# Patient Record
Sex: Male | Born: 1990 | Race: White | Hispanic: No | Marital: Single | Smoking: Current every day smoker
Health system: Southern US, Community
[De-identification: ages and names within clinical notes are randomized; demographics above are authoritative.]

---

## 2014-05-21 ENCOUNTER — Emergency Department (INDEPENDENT_AMBULATORY_CARE_PROVIDER_SITE_OTHER)
Admission: EM | Admit: 2014-05-21 | Discharge: 2014-05-21 | Disposition: A | Discharge status: Home / Self Care | Source: Home / Self Care | Attending: Emergency Medicine | Admitting: Emergency Medicine

## 2014-05-21 ENCOUNTER — Encounter: Payer: Self-pay | Admitting: Emergency Medicine

## 2014-05-21 DIAGNOSIS — J069 Acute upper respiratory infection, unspecified: Secondary | ICD-10-CM

## 2014-05-21 DIAGNOSIS — J029 Acute pharyngitis, unspecified: Secondary | ICD-10-CM

## 2014-05-21 DIAGNOSIS — J028 Acute pharyngitis due to other specified organisms: Secondary | ICD-10-CM

## 2014-05-21 LAB — POCT RAPID STREP A (OFFICE): Rapid Strep A Screen: NEGATIVE

## 2014-05-21 MED ORDER — AMOXICILLIN-POT CLAVULANATE 875-125 MG PO TABS: 1.0000 | ORAL_TABLET | Freq: Two times a day (BID) | ORAL | Status: DC

## 2014-05-21 MED ORDER — PREDNISONE (PAK) 10 MG PO TABS: ORAL_TABLET | Freq: Every day | ORAL | Status: DC

## 2014-05-21 NOTE — ED Notes (Signed)
Nicholas Dalton complains of sore throat, congestion, wheezing, hoarseness and productive cough with yellow sputum for 1 month. Denies fever, chills or sweats.

## 2014-05-21 NOTE — ED Provider Notes (Signed)
CSN: [id number]     Arrival date & time 05/21/14  1020 History   First MD Initiated Contact with Patient 05/21/14 1037     No chief complaint on file.  (Consider location/radiation/quality/duration/timing/severity/associated sxs/prior Treatment) HPI Nicholas Dalton is a 23 y.o. male who complains of onset of cold symptoms for a few weeks.  The symptoms are constant and mild-moderate in severity.  No history of allergies and has lived in [place] his whole life.  Hasn't taken any OTC meds. + sore throat + cough No pleuritic pain No wheezing + nasal congestion + post-nasal drainage + sinus pain/pressure No chest congestion No itchy/red eyes No earache No hemoptysis No SOB No chills/sweats No fever No nausea No vomiting No abdominal pain No diarrhea No skin rashes No fatigue No myalgias + headache     No past medical history on file. No past surgical history on file. No family history on file. History  Substance Use Topics  . Smoking status: Not on file  . Smokeless tobacco: Not on file  . Alcohol Use: Not on file    Review of Systems  All other systems reviewed and are negative.   Allergies  Review of patient's allergies indicates not on file.  Home Medications   Prior to Admission medications   Medication Sig Start Date End Date Taking? Authorizing Provider  amoxicillin-clavulanate (AUGMENTIN) 875-125 MG per tablet Take 1 tablet by mouth 2 (two) times daily. 05/21/14   Marlaine HindJeffrey H Henderson, MD  predniSONE (STERAPRED UNI-PAK) 10 MG tablet Take by mouth daily. 6 day pack, use as directed 05/21/14   Marlaine HindJeffrey H Henderson, MD   There were no vitals taken for this visit. Physical Exam  Nursing note and vitals reviewed. Constitutional: He is oriented to person, place, and time. He appears well-developed and well-nourished.  HENT:  Head: Normocephalic and atraumatic.  Right Ear: Tympanic membrane, external ear and ear canal normal.  Left Ear: Tympanic membrane, external ear and ear  canal normal.  Nose: Mucosal edema and rhinorrhea present.  Mouth/Throat: Posterior oropharyngeal erythema present. No oropharyngeal exudate or posterior oropharyngeal edema.  Eyes: No scleral icterus.  Neck: Neck supple.  Cardiovascular: Regular rhythm and normal heart sounds.   Pulmonary/Chest: Effort normal and breath sounds normal. No respiratory distress.  Neurological: He is alert and oriented to person, place, and time.  Skin: Skin is warm and dry.  Psychiatric: He has a normal mood and affect. His speech is normal.    ED Course  Procedures (including critical care time) Labs Review Labs Reviewed - No data to display  Imaging Review No results found.   MDM   1. Acute upper respiratory infections of unspecified site   2. Acute pharyngitis due to other specified organisms    1)  Take the prescribed antibiotic as instructed and prednisone.  Pt declines testing for Mono/EBV, but should do that if not improving.  Also consider Flonase if not improving. 2)  Use nasal saline solution (over the counter) at least 3 times a day. 3)  Use over the counter decongestants like Zyrtec-D every 12 hours as needed to help with congestion.  If you have hypertension, do not take medicines with sudafed.  4)  Can take tylenol every 6 hours or motrin every 8 hours for pain or fever. 5)  Follow up with your primary doctor if no improvement in 5-7 days, sooner if increasing pain, fever, or new symptoms.     Marlaine HindJeffrey H Henderson, MD 05/21/14 1052

## 2014-05-25 ENCOUNTER — Encounter (HOSPITAL_BASED_OUTPATIENT_CLINIC_OR_DEPARTMENT_OTHER): Payer: Self-pay | Admitting: Emergency Medicine

## 2014-05-25 ENCOUNTER — Emergency Department (HOSPITAL_BASED_OUTPATIENT_CLINIC_OR_DEPARTMENT_OTHER)

## 2014-05-25 ENCOUNTER — Emergency Department (HOSPITAL_BASED_OUTPATIENT_CLINIC_OR_DEPARTMENT_OTHER)
Admission: EM | Admit: 2014-05-25 | Discharge: 2014-05-25 | Disposition: A | Discharge status: Home / Self Care | Attending: Emergency Medicine | Admitting: Emergency Medicine

## 2014-05-25 DIAGNOSIS — J209 Acute bronchitis, unspecified: Secondary | ICD-10-CM | POA: Insufficient documentation

## 2014-05-25 DIAGNOSIS — Z79899 Other long term (current) drug therapy: Secondary | ICD-10-CM | POA: Insufficient documentation

## 2014-05-25 DIAGNOSIS — F17200 Nicotine dependence, unspecified, uncomplicated: Secondary | ICD-10-CM | POA: Insufficient documentation

## 2014-05-25 DIAGNOSIS — J40 Bronchitis, not specified as acute or chronic: Secondary | ICD-10-CM

## 2014-05-25 DIAGNOSIS — IMO0002 Reserved for concepts with insufficient information to code with codable children: Secondary | ICD-10-CM | POA: Insufficient documentation

## 2014-05-25 LAB — CBC WITH DIFFERENTIAL/PLATELET
BASOS ABS: 0 10*3/uL (ref 0.0–0.1)
Basophils Relative: 0 % (ref 0–1)
EOS PCT: 1 % (ref 0–5)
Eosinophils Absolute: 0.1 10*3/uL (ref 0.0–0.7)
HCT: 41.7 % (ref 39.0–52.0)
Hemoglobin: 13.9 g/dL (ref 13.0–17.0)
Lymphocytes Relative: 29 % (ref 12–46)
Lymphs Abs: 2.2 10*3/uL (ref 0.7–4.0)
MCH: 28.7 pg (ref 26.0–34.0)
MCHC: 33.3 g/dL (ref 30.0–36.0)
MCV: 86 fL (ref 78.0–100.0)
Monocytes Absolute: 0.8 10*3/uL (ref 0.1–1.0)
Monocytes Relative: 10 % (ref 3–12)
NEUTROS PCT: 60 % (ref 43–77)
Neutro Abs: 4.4 10*3/uL (ref 1.7–7.7)
Platelets: 260 10*3/uL (ref 150–400)
RBC: 4.85 MIL/uL (ref 4.22–5.81)
RDW: 12.7 % (ref 11.5–15.5)
WBC: 7.5 10*3/uL (ref 4.0–10.5)

## 2014-05-25 LAB — BASIC METABOLIC PANEL
Anion gap: 14 (ref 5–15)
BUN: 16 mg/dL (ref 6–23)
CALCIUM: 9.3 mg/dL (ref 8.4–10.5)
CO2: 26 mEq/L (ref 19–32)
Chloride: 103 mEq/L (ref 96–112)
Creatinine, Ser: 0.9 mg/dL (ref 0.50–1.35)
GFR calc non Af Amer: >90 mL/min (ref >90)
Glucose, Bld: 93 mg/dL (ref 70–99)
POTASSIUM: 4 meq/L (ref 3.7–5.3)
SODIUM: 143 meq/L (ref 137–147)

## 2014-05-25 MED ORDER — DM-GUAIFENESIN ER 30-600 MG PO TB12: 1.0000 | ORAL_TABLET | Freq: Two times a day (BID) | ORAL | Status: DC

## 2014-05-25 MED ORDER — ALBUTEROL SULFATE HFA 108 (90 BASE) MCG/ACT IN AERS: 1.0000 | INHALATION_SPRAY | Freq: Four times a day (QID) | RESPIRATORY_TRACT | Status: DC | PRN

## 2014-05-25 MED ORDER — AZITHROMYCIN 250 MG PO TABS: 250.0000 mg | ORAL_TABLET | Freq: Every day | ORAL | Status: DC

## 2014-05-25 NOTE — ED Notes (Signed)
Pt amb to room 4 with quick steady gait in nad. Pt reports one month of intermittent cough, seen by urgent care last week and rx prednisone and amox, cont with cough.

## 2014-05-25 NOTE — ED Provider Notes (Signed)
CSN: [id number]     Arrival date & time 05/25/14  [id number] History   First MD Initiated Contact with Patient 05/25/14 [phone]     Chief Complaint  Patient presents with  . Cough     (Consider location/radiation/quality/duration/timing/severity/associated sxs/prior Treatment) Patient is a 23 y.o. male presenting with cough. The history is provided by the patient.  Cough Associated symptoms: no chest pain, no fever, no headaches, no rash and no sore throat    patient with persistent intermittent cough. For the past week or 2. Started coughing up blood this morning. Less than a cup in size. No fevers. No sore throat. Patient was seen in urgent care last week did not have a chest x-ray but was treated with prednisone amoxicillin symptoms have continued. They felt at that time was consistent with a bronchitis. Patient without any other systemic symptoms.  History reviewed. No pertinent past medical history. History reviewed. No pertinent past surgical history. Family History  Problem Relation Age of Onset  . Arrhythmia Mother   . Diabetes Father   . Hypertension Father    History  Substance Use Topics  . Smoking status: Current Every Day Smoker -- 1.00 packs/day for 10 years    Types: Cigarettes  . Smokeless tobacco: Never Used  . Alcohol Use: Yes    Review of Systems  Constitutional: Negative for fever.  HENT: Positive for congestion. Negative for sore throat.   Eyes: Negative for visual disturbance.  Respiratory: Positive for cough.   Cardiovascular: Negative for chest pain.  Gastrointestinal: Negative for abdominal pain.  Genitourinary: Negative for hematuria.  Musculoskeletal: Negative for back pain.  Skin: Negative for rash.  Neurological: Negative for headaches.  Hematological: Does not bruise/bleed easily.  Psychiatric/Behavioral: Negative for confusion.      Allergies  Review of patient's allergies indicates no known allergies.  Home Medications   Prior to Admission  medications   Medication Sig Start Date End Date Taking? Authorizing Provider  albuterol (PROVENTIL HFA;VENTOLIN HFA) 108 (90 BASE) MCG/ACT inhaler Inhale 1-2 puffs into the lungs every 6 (six) hours as needed for wheezing or shortness of breath. 05/25/14   Vanetta MuldersScott Simara Rhyner, MD  amoxicillin-clavulanate (AUGMENTIN) 875-125 MG per tablet Take 1 tablet by mouth 2 (two) times daily. 05/21/14   Marlaine HindJeffrey H Henderson, MD  azithromycin (ZITHROMAX) 250 MG tablet Take 1 tablet (250 mg total) by mouth daily. Take first 2 tablets together, then 1 every day until finished. 05/25/14   Vanetta MuldersScott Elby Blackwelder, MD  dextromethorphan-[facility]aiFENesin [facility](MUCINEX DM) 30-600 MG per 12 hr tablet Take 1 tablet by mouth 2 (two) times daily. 05/25/14   Vanetta MuldersScott Josejuan Hoaglin, MD  predniSONE (STERAPRED UNI-PAK) 10 MG tablet Take by mouth daily. 6 day pack, use as directed 05/21/14   Marlaine HindJeffrey H Henderson, MD   BP 130/82  Pulse 76  Temp(Src) 98.8 F (37.1 C) (Oral)  Resp 18  SpO2 99% Physical Exam  Nursing note and vitals reviewed. Constitutional: He is oriented to person, place, and time. He appears well-developed and well-nourished. No distress.  HENT:  Head: Normocephalic and atraumatic.  Mouth/Throat: Oropharynx is clear and moist. No oropharyngeal exudate.  Eyes: Conjunctivae and EOM are normal. Pupils are equal, round, and reactive to light.  Neck: Normal range of motion.  Cardiovascular: Normal rate, regular rhythm and normal heart sounds.   No murmur heard. Pulmonary/Chest: Effort normal and breath sounds normal. No respiratory distress. He has no wheezes. He has no rales.  Abdominal: Soft. Bowel sounds are normal. There is no tenderness.  Musculoskeletal: Normal range of motion. He exhibits no edema.  Neurological: He is alert and oriented to person, place, and time. No cranial nerve deficit. He exhibits normal muscle tone. Coordination normal.  Skin: Skin is warm. No erythema.    ED Course  Procedures (including critical care  time) Labs Review Labs Reviewed  BASIC METABOLIC PANEL  CBC WITH DIFFERENTIAL   Results for orders placed during the hospital encounter of 05/25/14  BASIC METABOLIC PANEL      Result Value Ref Range   Sodium 143  137 - 147 mEq/L   Potassium 4.0  3.7 - 5.3 mEq/L   Chloride 103  96 - 112 mEq/L   CO2 26  19 - 32 mEq/L   Glucose, Bld 93  70 - 99 mg/dL   BUN 16  6 - 23 mg/dL   Creatinine, Ser 6.040.90  0.50 - 1.35 mg/dL   Calcium 9.3  8.4 - 54.010.5 mg/dL   GFR calc non Af Amer >90  >90 mL/min   GFR calc Af Amer >90  >90 mL/min   Anion gap 14  5 - 15  CBC WITH DIFFERENTIAL      Result Value Ref Range   WBC 7.5  4.0 - 10.5 K/uL   RBC 4.85  4.22 - 5.81 MIL/uL   Hemoglobin 13.9  13.0 - 17.0 g/dL   HCT 98.141.7  19.139.0 - 47.852.0 %   MCV 86.0  78.0 - 100.0 fL   MCH 28.7  26.0 - 34.0 pg   MCHC 33.3  30.0 - 36.0 g/dL   RDW 29.512.7  62.111.5 - 30.815.5 %   Platelets 260  150 - 400 K/uL   Neutrophils Relative % 60  43 - 77 %   Lymphocytes Relative 29  12 - 46 %   Monocytes Relative 10  3 - 12 %   Eosinophils Relative 1  0 - 5 %   Basophils Relative 0  0 - 1 %   Neutro Abs 4.4  1.7 - 7.7 K/uL   Lymphs Abs 2.2  0.7 - 4.0 K/uL   Monocytes Absolute 0.8  0.1 - 1.0 K/uL   Eosinophils Absolute 0.1  0.0 - 0.7 K/uL   Basophils Absolute 0.0  0.0 - 0.1 K/uL   Smear Review MORPHOLOGY UNREMARKABLE       Imaging Review Dg Chest 2 View  05/25/2014   CLINICAL DATA:  Cough.  EXAM: CHEST  2 VIEW  COMPARISON:  None.  FINDINGS: The heart size and mediastinal contours are within normal limits. Both lungs are clear. The visualized skeletal structures are unremarkable.  IMPRESSION: No active cardiopulmonary disease.   Electronically Signed   By: Maisie Fushomas  Register   On: 05/25/2014 10:18     EKG Interpretation None      MDM   Final diagnoses:  Bronchitis    Symptoms consistent with a bronchitis is just been lasting for a little bit longer than usual. Now coughing up some phlegm. Patient was seen in urgent care her last week  treated with prednisone and amoxicillin still with cough. We'll give a trial of Zithromax here. Mucinex DM and albuterol inhaler. Lab workup without any significant abnormalities no leukocytosis no anemia. Electrolytes normal. Chest x-ray negative for pneumonia pneumothorax or pulmonary edema. 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 Home on leave.    Vanetta MuldersScott Alvetta Hidrogo, MD 05/25/14 1130

## 2014-05-25 NOTE — Discharge Instructions (Signed)
Bronchitis Bronchitis is swelling (inflammation) of the air tubes leading to your lungs (bronchi). This causes mucus and a cough. If the swelling gets bad, you may have trouble breathing. HOME CARE   Rest.  Drink enough fluids to keep your pee (urine) clear or pale yellow (unless you have a condition where you have to watch how much you drink).  Only take medicine as told by your doctor. If you were given antibiotic medicines, finish them even if you start to feel better.  Avoid smoke, irritating chemicals, and strong smells. These make the problem worse. Quit smoking if you smoke. This helps your lungs heal faster.  Use a cool mist humidifier. Change the water in the humidifier every day. You can also sit in the bathroom with hot shower running for 5-10 minutes. Keep the door closed.  See your health care provider as told.  Wash your hands often. GET HELP IF: Your problems do not get better after 1 week. GET HELP RIGHT AWAY IF:   Your fever gets worse.  You have chills.  Your chest hurts.  Your problems breathing get worse.  You have blood in your mucus.  You pass out (faint).  You feel light-headed.  You have a bad headache.  You throw up (vomit) again and again. MAKE SURE YOU:  Understand these instructions.  Will watch your condition.  Will get help right away if you are not doing well or get worse. Document Released: 04/23/2008 Document Revised: 11/10/2013 Document Reviewed: 06/30/2013 [facility] Patient Information 2015 [employer], [place]. This information is not intended to replace advice given to you by your health care provider. Make sure you discuss any questions you have with your health care provider. Use medications as directed. Return for any newer worse symptoms. Chest x-ray was negative and labs were normal.

## 2016-02-06 IMAGING — CR DG CHEST 2V
2 series · 2 of 2 positions shown · non-contrast
Comparison: None.

CLINICAL DATA: Cough.

EXAM:
CHEST  2 VIEW

[w chest pa]
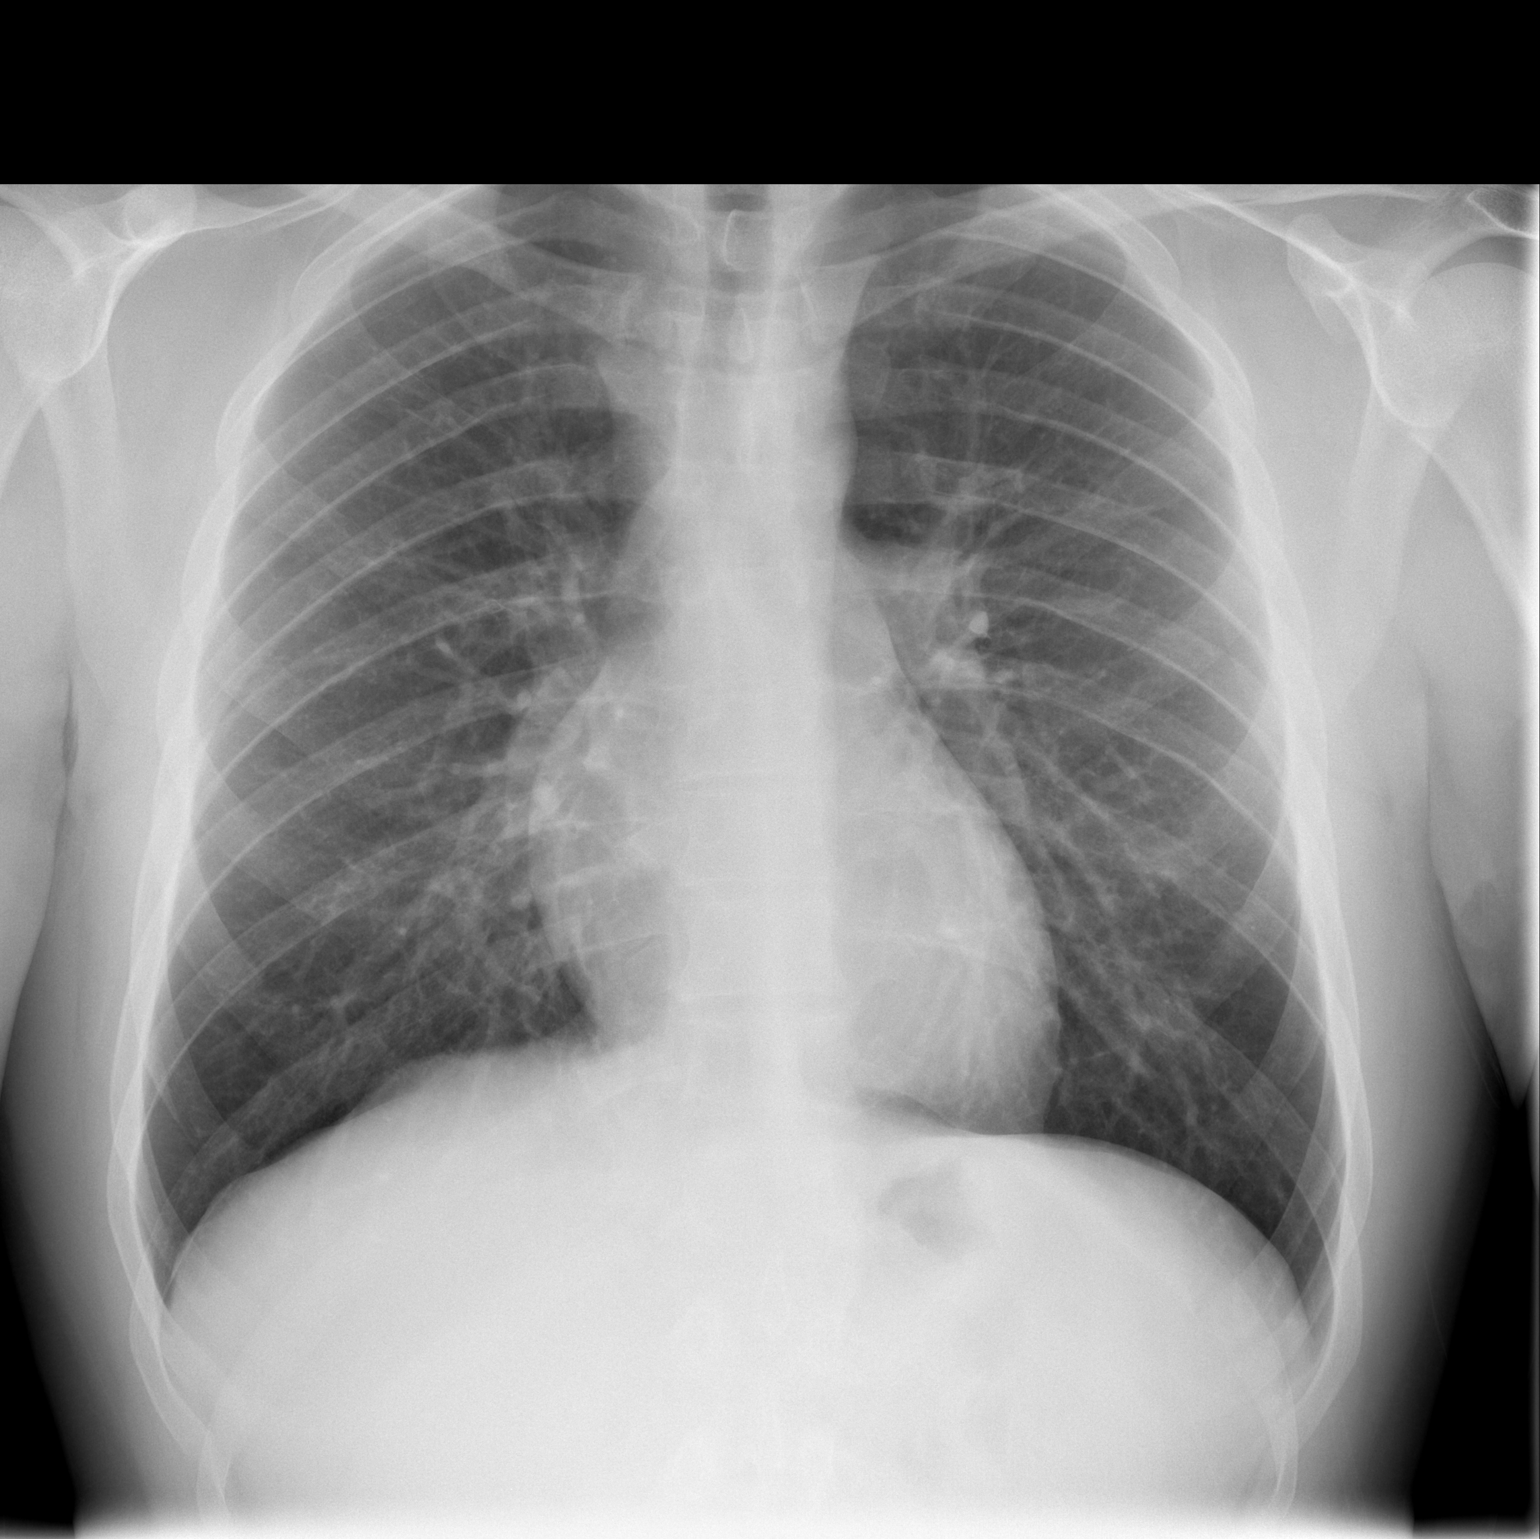

[w chest lat]
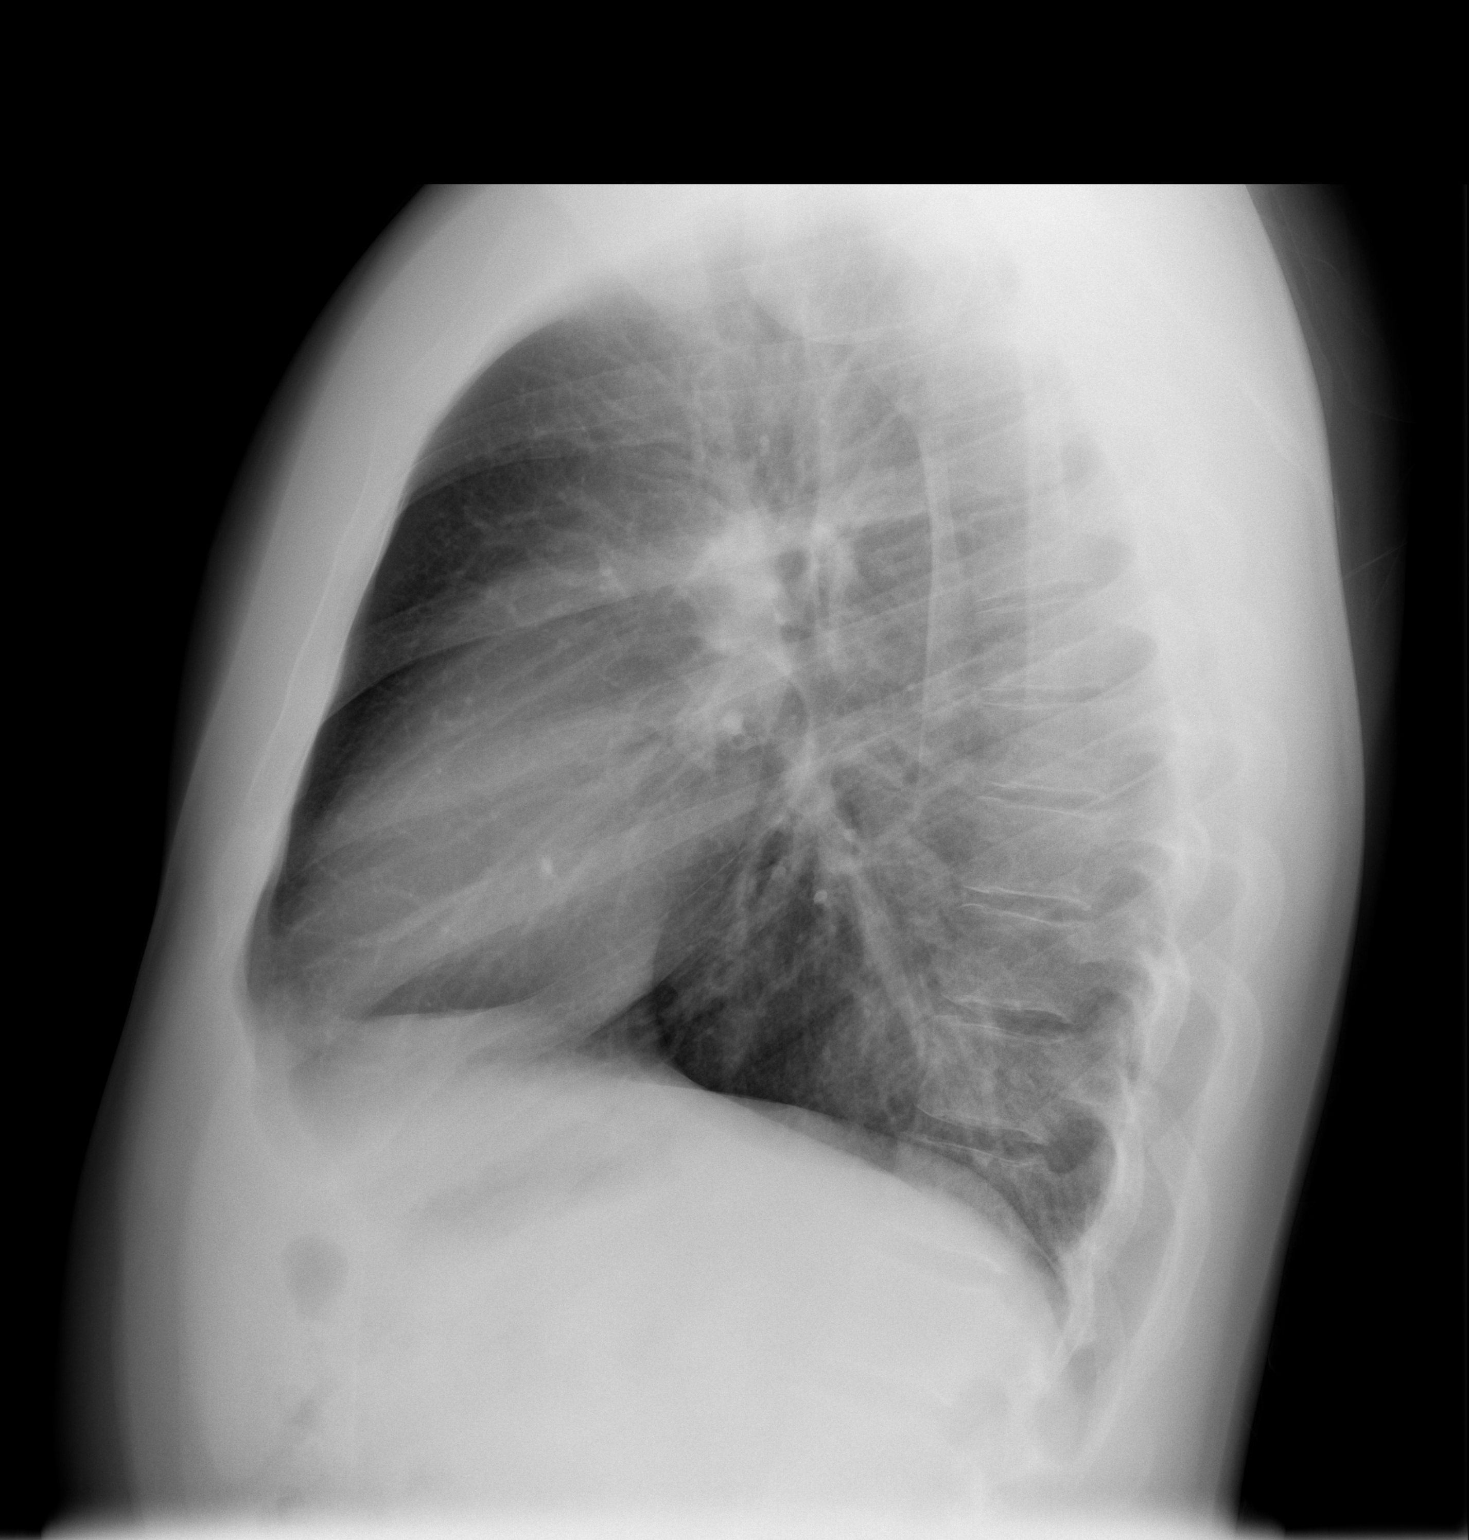

[2 of 2 positions shown; findings below may reference images not displayed]

FINDINGS: The heart size and mediastinal contours are within normal limits.
Both lungs are clear. The visualized skeletal structures are
unremarkable.
IMPRESSION: No active cardiopulmonary disease.

## 2022-09-03 ENCOUNTER — Ambulatory Visit
Admission: EM | Admit: 2022-09-03 | Discharge: 2022-09-03 | Disposition: A | Discharge status: Home / Self Care | Payer: No Typology Code available for payment source

## 2022-09-03 ENCOUNTER — Encounter: Payer: Self-pay | Admitting: Emergency Medicine

## 2022-09-03 DIAGNOSIS — H6123 Impacted cerumen, bilateral: Secondary | ICD-10-CM

## 2022-09-03 NOTE — ED Triage Notes (Signed)
Patient c/o bilateral ear pain for several weeks.  Patient states that he has pulled worms out of ears and would like the rest of them to be removed.

## 2022-09-03 NOTE — ED Provider Notes (Signed)
[facility]    CSN: [id number] Arrival date & time: 09/03/22  0816      History   Chief Complaint Chief Complaint  Patient presents with   Otalgia    HPI Nicholas Dalton is a 31 y.o. male.   HPI 31 year old male presents with bilateral ear pain for several weeks.  Patient reports that he has pulled worms out of his ears and would like the rest of them to be removed.  Patient is a current every day cigarette smoker.  History reviewed. No pertinent past medical history.  There are no problems to display for this patient.   History reviewed. No pertinent surgical history.     Home Medications    Prior to Admission medications   Medication Sig Start Date End Date Taking? Authorizing Provider  ARIPiprazole (ABILIFY) 30 MG tablet TAKE ONE TABLET BY MOUTH EVERY MORNING FOR MENTAL HEALTH 09/12/21  Yes [provider]  divalproex (DEPAKOTE ER) 500 MG 24 hr tablet Take 2 tablets by mouth every morning. 01/19/21  Yes [provider]    Family History Family History  Problem Relation Age of Onset   Arrhythmia Mother    Diabetes Father    Hypertension Father     Social History Social History   Tobacco Use   Smoking status: Every Day    Packs/day: 1.00    Years: 10.00    Total pack years: 10.00    Types: Cigarettes   Smokeless tobacco: Never  Substance Use Topics   Alcohol use: Yes   Drug use: No     Allergies   Patient has no known allergies.   Review of Systems Review of Systems  HENT:         Patient presents with possible ear worms for months, would like me to pull out worms from ears  All other systems reviewed and are negative.    Physical Exam Triage Vital Signs ED Triage Vitals [09/03/22 0838]  Enc Vitals Group     BP (!) 126/96     Pulse Rate 99     Resp 18     Temp 99 F (37.2 C)     Temp Source Oral     SpO2 98 %     Weight      Height      Head Circumference      Peak Flow      Pain Score      Pain Loc       Pain Edu?      Excl. in GC?    No data found.  Updated Vital Signs BP (!) 126/96 (BP Location: Right Arm)   Pulse 99   Temp 99 F (37.2 C) (Oral)   Resp 18   Ht 5\' 10"  (1.778 m)   SpO2 98%   BMI 27.69 kg/m    Physical Exam Vitals and nursing note reviewed.  Constitutional:      General: He is not in acute distress.    Appearance: Normal appearance. He is obese. He is not ill-appearing.  HENT:     Head: Normocephalic and atraumatic.     Right Ear: External ear normal.     Left Ear: External ear normal.     Ears:     Comments: Bilateral EACs occluded with cerumen unable to visualize either TM; post bilateral ear lavage: Unable to tolerate bilateral ear lavage    Mouth/Throat:     Mouth: Mucous membranes are moist.  Pharynx: Oropharynx is clear.  Eyes:     Conjunctiva/sclera: Conjunctivae normal.     Pupils: Pupils are equal, round, and reactive to light.  Cardiovascular:     Rate and Rhythm: Normal rate and regular rhythm.     Pulses: Normal pulses.     Heart sounds: Normal heart sounds. No murmur heard. Pulmonary:     Effort: Pulmonary effort is normal.     Breath sounds: Normal breath sounds. No wheezing, rhonchi or rales.  Musculoskeletal:        General: Normal range of motion.     Cervical back: Normal range of motion and neck supple.  Skin:    General: Skin is warm and dry.  Neurological:     General: No focal deficit present.     Mental Status: He is alert and oriented to person, place, and time.      UC Treatments / Results  Labs (all labs ordered are listed, but only abnormal results are displayed) Labs Reviewed - No data to display  EKG   Radiology No results found.  Procedures Procedures (including critical care time)  Medications Ordered in UC Medications - No data to display  Initial Impression / Assessment and Plan / UC Course  I have reviewed the triage vital signs and the nursing notes.  Pertinent labs & imaging results  that were available during my care of the patient were reviewed by me and considered in my medical decision making (see chart for details).     MDM: 1.  Bilateral impacted cerumen-patient unable to tolerate bilateral ear lavage. Advised patient to follow-up with [facility] [address], [place], [place] [address], [phone] for further evaluation of bilateral cerumen impaction.  Advised/encouraged patient to avoid using instruments to clean ear including Q-tips.  Patient discharged home, hemodynamically stable. Final Clinical Impressions(s) / UC Diagnoses   Final diagnoses:  Bilateral impacted cerumen     Discharge Instructions      Advised patient to follow-up with [facility] [address], [place], [place] [address], [phone] for further evaluation of bilateral cerumen impaction.  Advised/encouraged patient to avoid using instruments to clean ear including Q-tips.     ED Prescriptions   None    PDMP not reviewed this encounter.   Eliezer Lofts, [place] 09/03/22 [phone]

## 2022-09-03 NOTE — Discharge Instructions (Addendum)
Advised patient to follow-up with [facility] [address], [place], [place] [address], [phone] for further evaluation of bilateral cerumen impaction.  Advised/encouraged patient to avoid using instruments to clean ear including Q-tips.
